# Patient Record
Sex: Male | Born: 1960 | Race: White | Hispanic: No | Marital: Married | Smoking: Never smoker
Health system: Southern US, Community
[De-identification: ages and names within clinical notes are randomized; demographics above are authoritative.]

## PROBLEM LIST (undated history)

## (undated) DIAGNOSIS — I10 Essential (primary) hypertension: Secondary | ICD-10-CM

## (undated) DIAGNOSIS — E7800 Pure hypercholesterolemia, unspecified: Secondary | ICD-10-CM

## (undated) HISTORY — PX: KNEE SURGERY: SHX244

---

## 2009-04-29 ENCOUNTER — Encounter: Admission: RE | Admit: 2009-04-29 | Discharge: 2009-04-29 | Payer: Self-pay | Admitting: Emergency Medicine

## 2009-04-29 ENCOUNTER — Inpatient Hospital Stay (HOSPITAL_COMMUNITY): Admission: EM | Admit: 2009-04-29 | Discharge: 2009-04-30 | Payer: Self-pay | Admitting: Emergency Medicine

## 2009-05-20 ENCOUNTER — Encounter: Admission: RE | Admit: 2009-05-20 | Discharge: 2009-05-20 | Payer: Self-pay | Admitting: General Surgery

## 2009-05-31 ENCOUNTER — Encounter: Admission: RE | Admit: 2009-05-31 | Discharge: 2009-05-31 | Payer: Self-pay | Admitting: Gastroenterology

## 2009-11-07 ENCOUNTER — Telehealth: Payer: Self-pay | Admitting: Family Medicine

## 2010-05-16 NOTE — Progress Notes (Signed)
Summary: no show  Phone Note Outgoing Call   Call placed by: Raechel Ache, RN,  November 07, 2009 1:59 PM Summary of Call: no show for new pt appt  Follow-up for Phone Call        charge the NS fee Follow-up by: Nelwyn Salisbury MD,  November 08, 2009 9:51 AM

## 2010-07-02 LAB — CBC
Hemoglobin: 13.9 g/dL (ref 13.0–17.0)
MCHC: 34.4 g/dL (ref 30.0–36.0)
MCV: 91.5 fL (ref 78.0–100.0)
RDW: 12.7 % (ref 11.5–15.5)
WBC: 7.9 10*3/uL (ref 4.0–10.5)

## 2010-07-02 LAB — BASIC METABOLIC PANEL
BUN: 14 mg/dL (ref 6–23)
CO2: 25 mEq/L (ref 19–32)
Calcium: 8.5 mg/dL (ref 8.4–10.5)
Calcium: 8.9 mg/dL (ref 8.4–10.5)
Chloride: 104 mEq/L (ref 96–112)
GFR calc non Af Amer: >60 mL/min (ref >60)
Glucose, Bld: 91 mg/dL (ref 70–99)
Potassium: 4 mEq/L (ref 3.5–5.1)
Potassium: 4 mEq/L (ref 3.5–5.1)
Sodium: 137 mEq/L (ref 135–145)

## 2019-04-12 ENCOUNTER — Other Ambulatory Visit: Payer: Self-pay

## 2019-04-12 ENCOUNTER — Emergency Department (HOSPITAL_BASED_OUTPATIENT_CLINIC_OR_DEPARTMENT_OTHER)
Admission: EM | Admit: 2019-04-12 | Discharge: 2019-04-12 | Disposition: A | Discharge status: Home / Self Care | Payer: BLUE CROSS/BLUE SHIELD | Attending: Emergency Medicine | Admitting: Emergency Medicine

## 2019-04-12 ENCOUNTER — Emergency Department (HOSPITAL_BASED_OUTPATIENT_CLINIC_OR_DEPARTMENT_OTHER): Payer: BLUE CROSS/BLUE SHIELD

## 2019-04-12 ENCOUNTER — Encounter (HOSPITAL_BASED_OUTPATIENT_CLINIC_OR_DEPARTMENT_OTHER): Payer: Self-pay | Admitting: *Deleted

## 2019-04-12 DIAGNOSIS — I10 Essential (primary) hypertension: Secondary | ICD-10-CM | POA: Insufficient documentation

## 2019-04-12 DIAGNOSIS — R197 Diarrhea, unspecified: Secondary | ICD-10-CM | POA: Diagnosis not present

## 2019-04-12 DIAGNOSIS — Z20828 Contact with and (suspected) exposure to other viral communicable diseases: Secondary | ICD-10-CM | POA: Diagnosis not present

## 2019-04-12 DIAGNOSIS — R0602 Shortness of breath: Secondary | ICD-10-CM | POA: Diagnosis not present

## 2019-04-12 HISTORY — DX: Essential (primary) hypertension: I10

## 2019-04-12 HISTORY — DX: Pure hypercholesterolemia, unspecified: E78.00

## 2019-04-12 LAB — COMPREHENSIVE METABOLIC PANEL
ALT: 22 U/L (ref 0–44)
AST: 13 U/L — ABNORMAL LOW (ref 15–41)
Albumin: 3.5 g/dL (ref 3.5–5.0)
Alkaline Phosphatase: 116 U/L (ref 38–126)
Anion gap: 7 (ref 5–15)
BUN: 14 mg/dL (ref 6–20)
CO2: 29 mmol/L (ref 22–32)
Calcium: 8.9 mg/dL (ref 8.9–10.3)
Chloride: 102 mmol/L (ref 98–111)
Creatinine, Ser: 1.08 mg/dL (ref 0.61–1.24)
GFR calc Af Amer: >60 mL/min (ref >60)
GFR calc non Af Amer: >60 mL/min (ref >60)
Glucose, Bld: 115 mg/dL — ABNORMAL HIGH (ref 70–99)
Potassium: 4 mmol/L (ref 3.5–5.1)
Sodium: 138 mmol/L (ref 135–145)
Total Bilirubin: 0.5 mg/dL (ref 0.3–1.2)
Total Protein: 6.5 g/dL (ref 6.5–8.1)

## 2019-04-12 LAB — CBC WITH DIFFERENTIAL/PLATELET
Abs Immature Granulocytes: 0.58 10*3/uL — ABNORMAL HIGH (ref 0.00–0.07)
Basophils Absolute: 0.1 10*3/uL (ref 0.0–0.1)
Basophils Relative: 1 %
Eosinophils Absolute: 1.1 10*3/uL — ABNORMAL HIGH (ref 0.0–0.5)
Eosinophils Relative: 8 %
HCT: 43.9 % (ref 39.0–52.0)
Hemoglobin: 14.8 g/dL (ref 13.0–17.0)
Immature Granulocytes: 4 %
Lymphocytes Relative: 13 %
Lymphs Abs: 1.8 10*3/uL (ref 0.7–4.0)
MCH: 31.4 pg (ref 26.0–34.0)
MCHC: 33.7 g/dL (ref 30.0–36.0)
MCV: 93 fL (ref 80.0–100.0)
Monocytes Absolute: 0.8 10*3/uL (ref 0.1–1.0)
Monocytes Relative: 6 %
Neutro Abs: 9.8 10*3/uL — ABNORMAL HIGH (ref 1.7–7.7)
Neutrophils Relative %: 68 %
Platelets: 300 10*3/uL (ref 150–400)
RBC: 4.72 MIL/uL (ref 4.22–5.81)
RDW: 12.6 % (ref 11.5–15.5)
WBC: 14.2 10*3/uL — ABNORMAL HIGH (ref 4.0–10.5)
nRBC: 0 % (ref 0.0–0.2)

## 2019-04-12 LAB — TROPONIN I (HIGH SENSITIVITY): Troponin I (High Sensitivity): 3 ng/L (ref <18)

## 2019-04-12 LAB — SARS CORONAVIRUS 2 AG (30 MIN TAT): SARS Coronavirus 2 Ag: NEGATIVE

## 2019-04-12 LAB — BRAIN NATRIURETIC PEPTIDE: B Natriuretic Peptide: 12.8 pg/mL (ref 0.0–100.0)

## 2019-04-12 MED ORDER — LOPERAMIDE HCL 2 MG PO CAPS: 2.0000 mg | ORAL_CAPSULE | Freq: Four times a day (QID) | ORAL | 0 refills | Status: AC | PRN

## 2019-04-12 MED ORDER — ALBUTEROL SULFATE HFA 108 (90 BASE) MCG/ACT IN AERS: 1.0000 | INHALATION_SPRAY | Freq: Four times a day (QID) | RESPIRATORY_TRACT | 0 refills | Status: AC | PRN

## 2019-04-12 MED ORDER — ALBUTEROL SULFATE HFA 108 (90 BASE) MCG/ACT IN AERS
2.0000 | INHALATION_SPRAY | Freq: Once | RESPIRATORY_TRACT | Status: AC
  Administered 2019-04-12: 2 via RESPIRATORY_TRACT
  Filled 2019-04-12: qty 6.7

## 2019-04-12 NOTE — Progress Notes (Signed)
Patient was removed from 2 liter nasal cannula and given an albuterol MDI treatment.  After waiting 5 minutes patient ambulated around the room seven times.  Patient's SPO2 remained between 97% and 98% while ambulating.  Patient stated that he was no longer SOB and was breathing better.

## 2019-04-12 NOTE — ED Provider Notes (Signed)
Emergency Department Provider Note   I have reviewed the triage vital signs and the nursing notes.   HISTORY  Chief Complaint Shortness of Breath   HPI Jorge Washington is a 58 y.o. male with PMH of HTN and HLD presents to the emergency department today with shortness of breath and diarrhea symptoms.  Patient symptoms are worsening over the past 2 to 3 days.  He has not had known Covid exposure.  Patient states he attributes the diarrhea to Augmentin when she started taking for a dental infection and diarrhea symptoms started shortly afterwards.  He does not typically develop shortness of breath but over the past 2 to 3 days has become more dyspneic and feels a wheezing sensation.  Symptoms are worse with walking but is also feeling short of breath at rest.  He is not having chest pain or heart palpitations.  He does not smoke or limit smoking.  No prior history of COPD or asthma.  He has not noticed fevers, shaking chills, body aches. No radiation of symptoms or modifying factors.    Past Medical History:  Diagnosis Date  . High cholesterol   . Hypertension     There are no problems to display for this patient.   Past Surgical History:  Procedure Laterality Date  . KNEE SURGERY      Allergies Patient has no known allergies.  No family history on file.  Social History Social History   Tobacco Use  . Smoking status: Never Smoker  . Smokeless tobacco: Never Used  Substance Use Topics  . Alcohol use: Yes  . Drug use: Never    Review of Systems  Constitutional: No fever/chills. Positive fatigue.  Eyes: No visual changes. ENT: No sore throat. Cardiovascular: Denies chest pain. Respiratory: Positive shortness of breath. Gastrointestinal: Positive diffuse abdominal pain.  No nausea, no vomiting. Positive watery diarrhea.  No constipation. Genitourinary: Negative for dysuria. Musculoskeletal: Negative for back pain. Skin: Negative for rash. Neurological: Negative  for headaches, focal weakness or numbness.  10-point ROS otherwise negative.  ____________________________________________   PHYSICAL EXAM:  VITAL SIGNS: ED Triage Vitals  Enc Vitals Group     BP 04/12/19 2147 137/88     Pulse Rate 04/12/19 2147 94     Resp 04/12/19 2147 (!) 24     Temp 04/12/19 2147 98.4 F (36.9 C)     Temp src --      SpO2 04/12/19 2147 91 %     Weight 04/12/19 2150 245 lb (111.1 kg)     Height 04/12/19 2150 5\' 11"  (1.803 m)   Constitutional: Alert and oriented. Well appearing and in no acute distress. Eyes: Conjunctivae are normal.  Head: Atraumatic. Nose: No congestion/rhinnorhea. Mouth/Throat: Mucous membranes are moist.  Neck: No stridor.   Cardiovascular: Normal rate, regular rhythm. Good peripheral circulation. Grossly normal heart sounds.   Respiratory: Slight increased respiratory effort.  No retractions. Lungs with faint end-expiratory wheeze on exam worse on the left.  Gastrointestinal: Soft and nontender. No distention.  Musculoskeletal: No lower extremity tenderness nor edema.  Neurologic:  Normal speech and language.  Skin:  Skin is warm, dry and intact. No rash noted.   ____________________________________________   LABS (all labs ordered are listed, but only abnormal results are displayed)  Labs Reviewed  COMPREHENSIVE METABOLIC PANEL - Abnormal; Notable for the following components:      Result Value   Glucose, Bld 115 (*)    AST 13 (*)    All other components  within normal limits  CBC WITH DIFFERENTIAL/PLATELET - Abnormal; Notable for the following components:   WBC 14.2 (*)    Neutro Abs 9.8 (*)    Eosinophils Absolute 1.1 (*)    Abs Immature Granulocytes 0.58 (*)    All other components within normal limits  SARS CORONAVIRUS 2 AG (30 MIN TAT)  NOVEL CORONAVIRUS, NAA (HOSP ORDER, SEND-OUT TO REF LAB; TAT 18-24 HRS)  BRAIN NATRIURETIC PEPTIDE  TROPONIN I (HIGH SENSITIVITY)  TROPONIN I (HIGH SENSITIVITY)    ____________________________________________  EKG   EKG Interpretation  Date/Time:  Sunday April 12 2019 21:50:26 EST Ventricular Rate:  96 PR Interval:    QRS Duration: 91 QT Interval:  331 QTC Calculation: 419 R Axis:   81 Text Interpretation: Sinus rhythm Artifact in lead(s) I II aVR aVL aVF V4 V5 V6 and baseline wander in lead(s) V2 V3 No clear STEMI. Confirmed by Alona Bene [phone]) on 04/12/2019 9:56:03 PM Also confirmed by Alona Bene [phone]), editor Elita Quick [phone])  on 04/13/2019 11:45:49 AM       ____________________________________________  RADIOLOGY  DG Chest Portable 1 View  Result Date: 04/12/2019 CLINICAL DATA:  Shortness of breath EXAM: PORTABLE CHEST 1 VIEW COMPARISON:  None. FINDINGS: No consolidation, features of edema, pneumothorax, or effusion. Pulmonary vascularity is normally distributed. The cardiomediastinal contours are unremarkable. No acute osseous or soft tissue abnormality. IMPRESSION: No acute cardiopulmonary abnormality. Electronically Signed   By: Kreg Shropshire M.D.   On: 04/12/2019 22:29    ____________________________________________   PROCEDURES  Procedure(s) performed:   Procedures  None ____________________________________________   INITIAL IMPRESSION / ASSESSMENT AND PLAN / ED COURSE  Pertinent labs & imaging results that were available during my care of the patient were reviewed by me and considered in my medical decision making (see chart for details).   Patient presents to the emergency department with shortness of breath, mild wheezing on exam, history of diarrhea.  Possible that diarrhea is related to recent antibiotics but also considering COVID-19 is a possible diagnosis.  We will start with antigen test, labs including troponin, chest x-ray, albuterol, ambulatory pulse ox, reassess.  Lower suspicion for PE or atypical ACS.   Patient ambulated with improved O2 while ambulating. Feeling better after  albuterol. Wheezing resolved on exam. Suspect reactive airway disease with viral type process. Diarrhea may be related to abx. No focal tenderness on abdominal exam to prompt imaging. COVID PCR sent and patient to isolated until results come back in MyChart. Discussed ED return precautions.  ____________________________________________  FINAL CLINICAL IMPRESSION(S) / ED DIAGNOSES  Final diagnoses:  SOB (shortness of breath)  Diarrhea of presumed infectious origin     MEDICATIONS GIVEN DURING THIS VISIT:  Medications  albuterol (VENTOLIN HFA) 108 (90 Base) MCG/ACT inhaler 2 puff (2 puffs Inhalation Given 04/12/19 2226)     NEW OUTPATIENT MEDICATIONS STARTED DURING THIS VISIT:  Discharge Medication List as of 04/12/2019 11:09 PM    START taking these medications   Details  albuterol (VENTOLIN HFA) 108 (90 Base) MCG/ACT inhaler Inhale 1-2 puffs into the lungs every 6 (six) hours as needed for wheezing or shortness of breath., Starting Sun 04/12/2019, Print    loperamide (IMODIUM) 2 MG capsule Take 1 capsule (2 mg total) by mouth 4 (four) times daily as needed for diarrhea or loose stools., Starting Sun 04/12/2019, Print        Note:  This document was prepared using Dragon voice recognition software and may include unintentional dictation errors.  Ivin Booty  Pedro Oldenburg, MD, [facility] Emergency Medicine    Ilyanna Baillargeon, Wonda Olds, MD 04/13/19 [phone]

## 2019-04-12 NOTE — ED Triage Notes (Signed)
Pt presents with SOB and diarrhea x 4 days. O2 sats 90-91 upon arrival

## 2019-04-12 NOTE — Discharge Instructions (Addendum)
You were seen in the emergency department today with shortness of breath and diarrhea.  Your rapid Covid test is negative but I am sending a confirmatory test.  This will take 1 to 2 days to result and will show up in your MyChart app.  There is information in this paperwork about how to set up that.  Please remain in isolation until you get this result and you are feeling better for at least 3 days.  You can take Imodium as needed for diarrhea.  Use the albuterol as needed for shortness of breath.  Return to the emergency department with worsening symptoms including shortness of breath, chest pain, abdominal pain, fevers.

## 2019-04-12 NOTE — Progress Notes (Signed)
Patient's SPO2 was 90%.  Placed patient on 2 liter nasal cannula and SPO2 increased to 97%.  Patients ride side breath sounds were diminished and left side breath sounds had an inspiratory wheeze at the end of inspiration and an expiratory wheeze at the end of expiration.

## 2019-04-12 NOTE — ED Notes (Signed)
ED Provider at bedside. 

## 2019-04-14 LAB — NOVEL CORONAVIRUS, NAA (HOSP ORDER, SEND-OUT TO REF LAB; TAT 18-24 HRS): SARS-CoV-2, NAA: NOT DETECTED

## 2019-07-09 ENCOUNTER — Ambulatory Visit: Payer: BLUE CROSS/BLUE SHIELD

## 2019-07-10 ENCOUNTER — Ambulatory Visit: Payer: BLUE CROSS/BLUE SHIELD | Attending: Internal Medicine

## 2019-07-10 DIAGNOSIS — Z23 Encounter for immunization: Secondary | ICD-10-CM

## 2019-07-10 NOTE — Progress Notes (Signed)
   Covid-19 Vaccination Clinic  Name:  Jorge Washington    MRN: [id number] DOB: 08/28/1960  07/10/2019  [name] was observed post Covid-19 immunization for 15 minutes without incident. He was provided with Vaccine Information Sheet and instruction to access the V-Safe system.   [name] was instructed to call 911 with any severe reactions post vaccine: Marland Kitchen Difficulty breathing  . Swelling of face and throat  . A fast heartbeat  . A bad rash all over body  . Dizziness and weakness   Immunizations Administered    Name Date Dose VIS Date Route   Pfizer COVID-19 Vaccine 07/10/2019 10:18 AM 0.3 mL 03/27/2019 Intramuscular   Manufacturer: [employer], [employer]   Lot: [id number]   NDC: [address]-5

## 2019-07-28 ENCOUNTER — Ambulatory Visit: Payer: BLUE CROSS/BLUE SHIELD | Attending: Internal Medicine

## 2019-07-28 DIAGNOSIS — Z23 Encounter for immunization: Secondary | ICD-10-CM

## 2019-07-28 NOTE — Progress Notes (Signed)
   Covid-19 Vaccination Clinic  Name:  Agnes Probert    MRN: [id number] DOB: 1961-02-14  07/28/2019  Mr. Kenley was observed post Covid-19 immunization for 15 minutes without incident. He was provided with Vaccine Information Sheet and instruction to access the V-Safe system.   Mr. Loeffelholz was instructed to call 911 with any severe reactions post vaccine: Marland Kitchen Difficulty breathing  . Swelling of face and throat  . A fast heartbeat  . A bad rash all over body  . Dizziness and weakness   Immunizations Administered    Name Date Dose VIS Date Route   Pfizer COVID-19 Vaccine 07/28/2019  8:42 AM 0.3 mL 03/27/2019 Intramuscular   Manufacturer: [employer], [employer]   Lot: [id number]   NDC: [address]-0

## 2019-08-04 ENCOUNTER — Ambulatory Visit: Payer: BLUE CROSS/BLUE SHIELD

## 2020-01-08 IMAGING — DX DG CHEST 1V PORT
1 series · 1 of 1 positions shown · non-contrast
Comparison: None.

CLINICAL DATA: Shortness of breath

EXAM:
PORTABLE CHEST 1 VIEW

[chest ap]
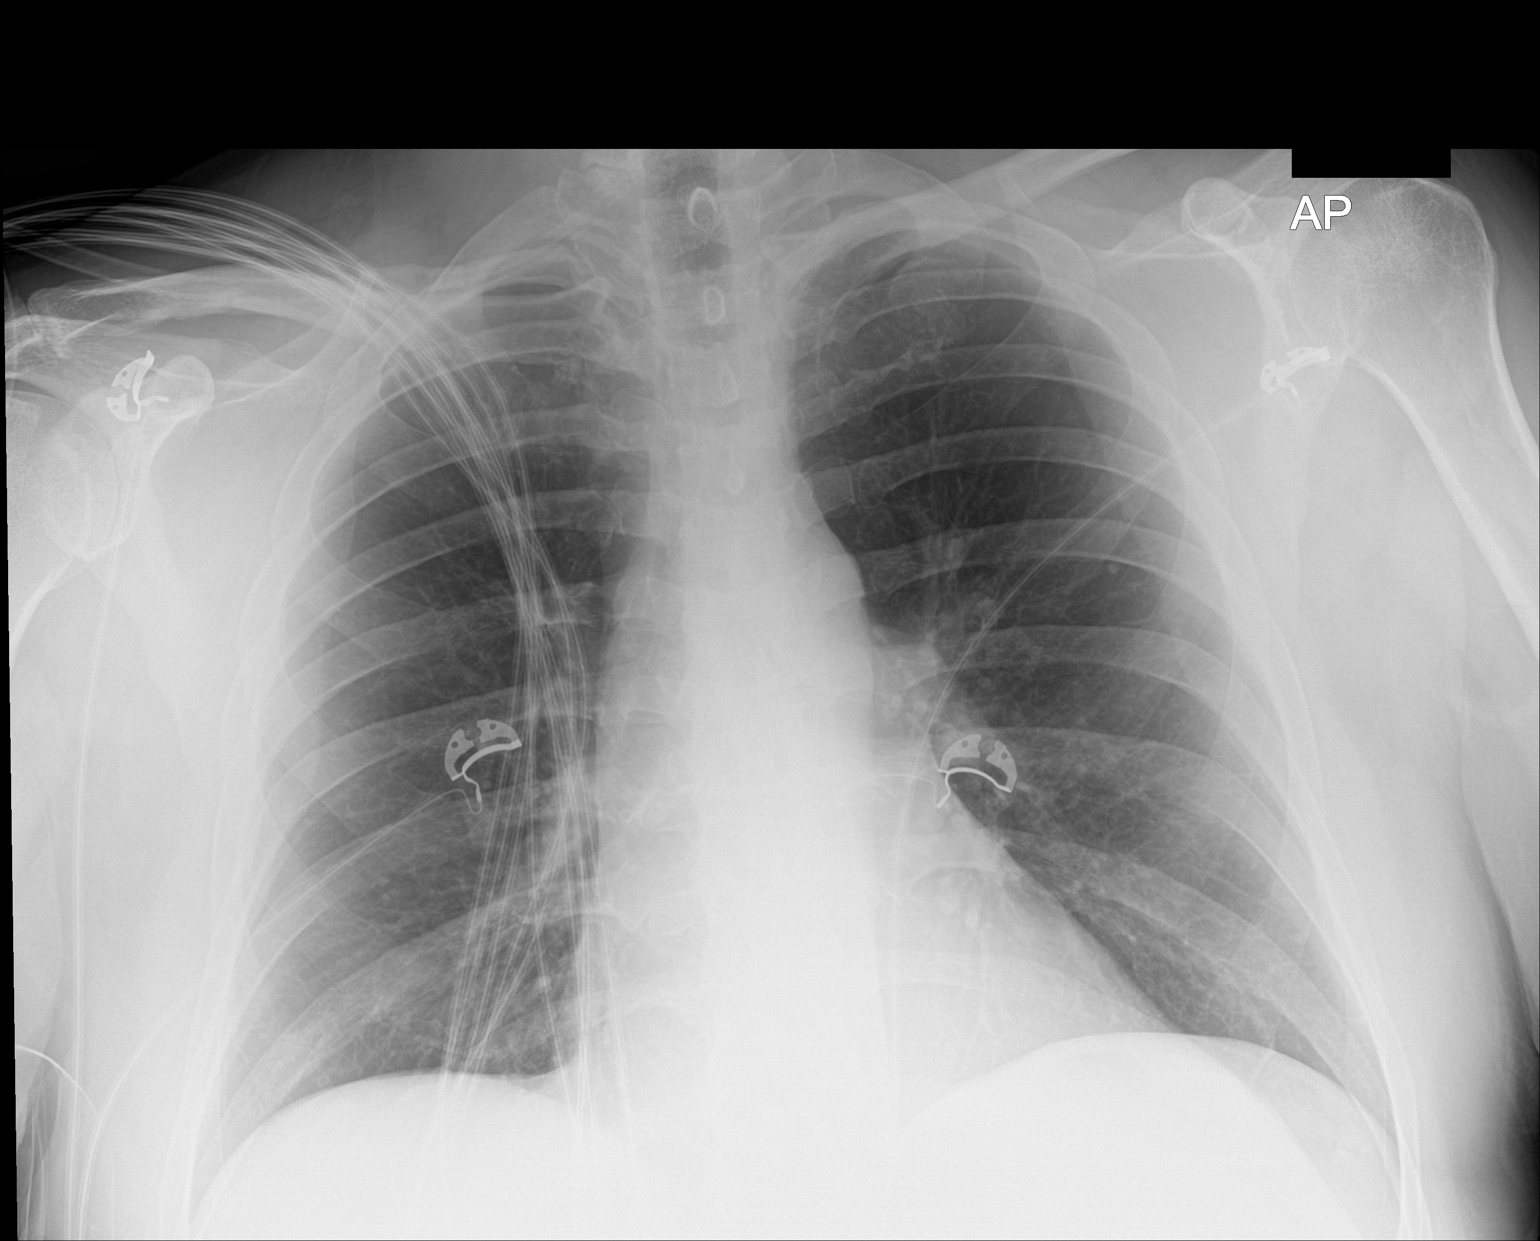

[1 of 1 positions shown; findings below may reference images not displayed]

FINDINGS: No consolidation, features of edema, pneumothorax, or effusion.
Pulmonary vascularity is normally distributed. The cardiomediastinal
contours are unremarkable. No acute osseous or soft tissue
abnormality.
IMPRESSION: No acute cardiopulmonary abnormality.
